# Patient Record
Sex: Female | Born: 1995 | Race: Black or African American | Hispanic: No | Marital: Single
Health system: Southern US, Community
[De-identification: ages and names within clinical notes are randomized; demographics above are authoritative.]

---

## 2020-10-06 ENCOUNTER — Other Ambulatory Visit: Payer: Self-pay

## 2020-10-06 ENCOUNTER — Emergency Department (HOSPITAL_COMMUNITY): Payer: Worker's Compensation

## 2020-10-06 ENCOUNTER — Encounter (HOSPITAL_COMMUNITY): Payer: Self-pay | Admitting: Emergency Medicine

## 2020-10-06 ENCOUNTER — Emergency Department (HOSPITAL_COMMUNITY)
Admission: EM | Admit: 2020-10-06 | Discharge: 2020-10-06 | Disposition: A | Discharge status: Home / Self Care | Payer: Worker's Compensation | Attending: Emergency Medicine | Admitting: Emergency Medicine

## 2020-10-06 DIAGNOSIS — S99911A Unspecified injury of right ankle, initial encounter: Secondary | ICD-10-CM | POA: Insufficient documentation

## 2020-10-06 DIAGNOSIS — X501XXA Overexertion from prolonged static or awkward postures, initial encounter: Secondary | ICD-10-CM | POA: Diagnosis not present

## 2020-10-06 NOTE — ED Provider Notes (Signed)
[facility]-EMERGENCY DEPT Provider Note   CSN: [id number] Arrival date & time: 10/06/20  1748     History Chief Complaint  Patient presents with  . Ankle Pain    Cynthia Meza is a 24 y.o. female.  HPI      Cynthia Meza is a 24 y.o. female, presenting to the ED with right ankle injury that occurred earlier today.  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 Moderate throbbing, nonradiating pain to the right lateral ankle. She took Tylenol prior to arrival.  Denies knee pain, foot pain, numbness, weakness, or any other complaints.  History reviewed. No pertinent past medical history.  There are no problems to display for this patient.   History reviewed. No pertinent surgical history.   OB History   No obstetric history on file.     History reviewed. No pertinent family history.  Social History   Tobacco Use  . Smoking status: Not on file  Substance Use Topics  . Alcohol use: Not on file  . Drug use: Not on file    Home Medications Prior to Admission medications   Not on File    Allergies    Patient has no known allergies.  Review of Systems   Review of Systems  Musculoskeletal: Positive for arthralgias and joint swelling.  Neurological: Negative for weakness and numbness.    Physical Exam Updated Vital Signs BP (!) 136/98 (BP Location: Right Arm)   Pulse 95   Temp 98.4 F (36.9 C) (Oral)   Resp 15   Ht 5\' 5"  (1.651 m)   Wt 63.5 kg   LMP 09/08/2020   SpO2 97%   BMI 23.30 kg/m   Physical Exam Vitals and nursing note reviewed.  Constitutional:      General: She is not in acute distress.    Appearance: She is well-developed. She is not diaphoretic.  HENT:     Head: Normocephalic and atraumatic.  Eyes:     Conjunctiva/sclera: Conjunctivae normal.  Cardiovascular:     Rate and Rhythm: Normal rate and regular rhythm.     Pulses:          Dorsalis pedis pulses are  2+ on the right side.       Posterior tibial pulses are 2+ on the right side.  Pulmonary:     Effort: Pulmonary effort is normal.  Musculoskeletal:     Cervical back: Neck supple.     Comments: Tenderness and swelling over the right lateral malleolus of the right ankle.  No noted deformity or instability.  No pain, tenderness, or other signs of injury to the right foot, right knee, or the rest of the right lower extremity. Full range of motion intact in the right knee without pain or noted difficulty.  Skin:    General: Skin is warm and dry.     Capillary Refill: Capillary refill takes less than 2 seconds.     Coloration: Skin is not pale.  Neurological:     Mental Status: She is alert.     Comments: Sensation to light touch grossly intact in the right foot and toes. Strength and motor function intact in the right toes and foot.  Psychiatric:        Behavior: Behavior normal.     ED Results / Procedures / Treatments   Labs (all labs ordered are listed, but only abnormal results are displayed) Labs Reviewed - No data to display  EKG None  Radiology DG Ankle Complete Right  Result Date: 10/06/2020 CLINICAL DATA:  Pain status post fall EXAM: RIGHT ANKLE - COMPLETE 3+ VIEW COMPARISON:  None. FINDINGS: There is mild soft tissue swelling about the lateral malleolus. There is no acute displaced fracture or dislocation. There is an ankle joint effusion. IMPRESSION: Mild soft tissue swelling about the lateral malleolus with an ankle joint effusion. No acute displaced fracture or dislocation. No ankle joint effusion. Electronically Signed   By: Katherine Mantle M.D.   On: 10/06/2020 18:42    Procedures Procedures (including critical care time)  Medications Ordered in ED Medications - No data to display  ED Course  I have reviewed the triage vital signs and the nursing notes.  Pertinent labs & imaging results that were available during my care of the patient were reviewed by me and  considered in my medical decision making (see chart for details).    MDM Rules/Calculators/A&P                          Patient presents with injury to the ankle.  No evidence of neurovascular compromise.   I personally reviewed and interpreted the patient's x-rays.  No acute osseous abnormality noted.  Patient placed in a cam boot for stabilization, given crutches, and information on orthopedic follow-up. The patient was given instructions for home care as well as return precautions. Patient voices understanding of these instructions, accepts the plan, and is comfortable with discharge.   Patient was patientered additional analgesia here in the ED, but declined.   Final Clinical Impression(s) / ED Diagnoses Final diagnoses:  Injury of right ankle, initial encounter    Rx / DC Orders ED Discharge Orders    None       Concepcion Living 10/06/20 1924    Rolan Bucco, MD 10/06/20 1952

## 2020-10-06 NOTE — ED Notes (Signed)
Ortho tech called to apply boot and crutches.

## 2020-10-06 NOTE — ED Triage Notes (Signed)
Patient reports to the ER from twisting her ankle out. 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

## 2020-10-06 NOTE — Discharge Instructions (Signed)
You have been seen today for an ankle injury. There were no acute abnormalities on the x-rays, including no sign of fracture or dislocation, however, there could be injuries to the soft tissues, such as the ligaments or tendons that are not seen on xrays. There could also be what are called occult fractures that are small fractures not seen on xray. °Antiinflammatory medications: Take 600 mg of ibuprofen every 6 hours or 440 mg (over the counter dose) to 500 mg (prescription dose) of naproxen every 12 hours for the next 3 days. After this time, these medications may be used as needed for pain. Take these medications with food to avoid upset stomach. Choose only one of these medications, do not take them together. °Acetaminophen (generic for Tylenol): Should you continue to have additional pain while taking the ibuprofen or naproxen, you may add in acetaminophen as needed. Your daily total maximum amount of acetaminophen from all sources should be limited to 4000mg/day for persons without liver problems, or 2000mg/day for those with liver problems. °Ice: May apply ice to the area over the next 24 hours for 15 minutes at a time to reduce swelling. °Elevation: Keep the extremity elevated as often as possible to reduce pain and inflammation. °Support: Wear the cam boot for support and comfort. Wear this until pain resolves. You will be weight-bearing as tolerated, which means you can slowly start to put weight on the extremity and increase amount and frequency as pain allows. °Follow up: If symptoms are improving, you may follow up with your primary care provider for any continued management. If symptoms are not starting to improve within a week, you should follow up with the orthopedic specialist within two weeks. °Return: Return to the ED for numbness, weakness, increasing pain, overall worsening symptoms, loss of function, or if symptoms are not improving, you have tried to follow up with the orthopedic specialist,  and have been unable to do so. ° °For prescription assistance, may try using prescription discount sites or apps, such as goodrx.com or Good Rx smart phone app. °

## 2021-12-22 IMAGING — CR DG ANKLE COMPLETE 3+V*R*
3 series · 3 of 3 positions shown · non-contrast
Comparison: None.

CLINICAL DATA: Pain status post fall

EXAM:
RIGHT ANKLE - COMPLETE 3+ VIEW

[x ankle ap right]
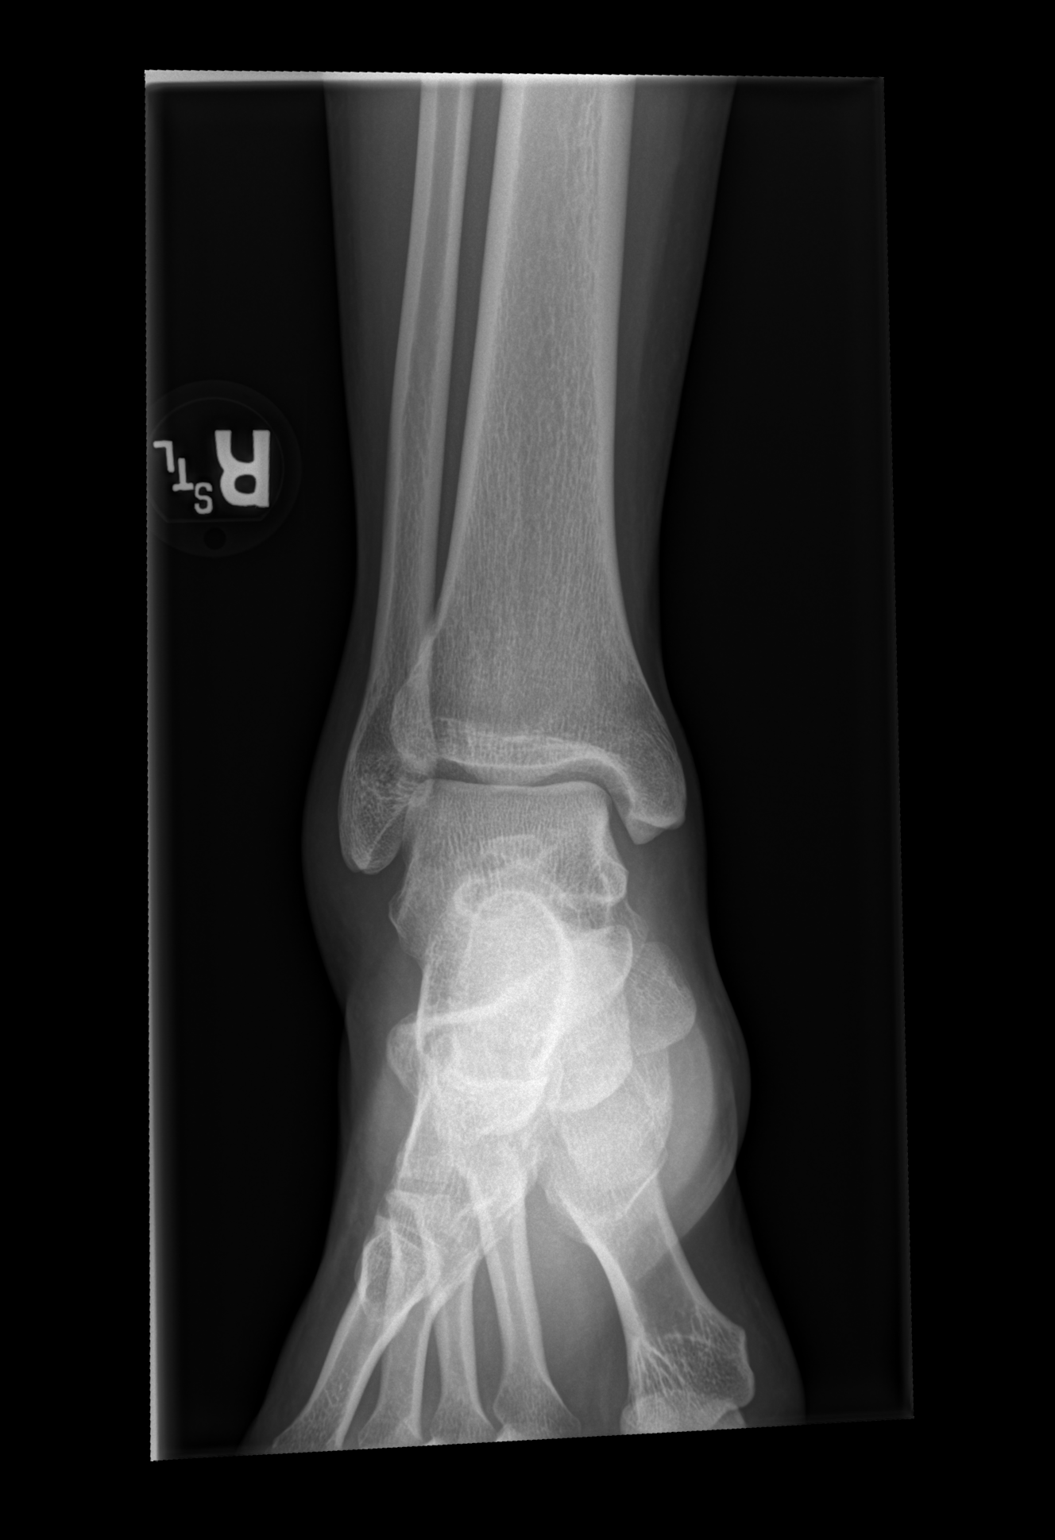

[x ankle obl right]
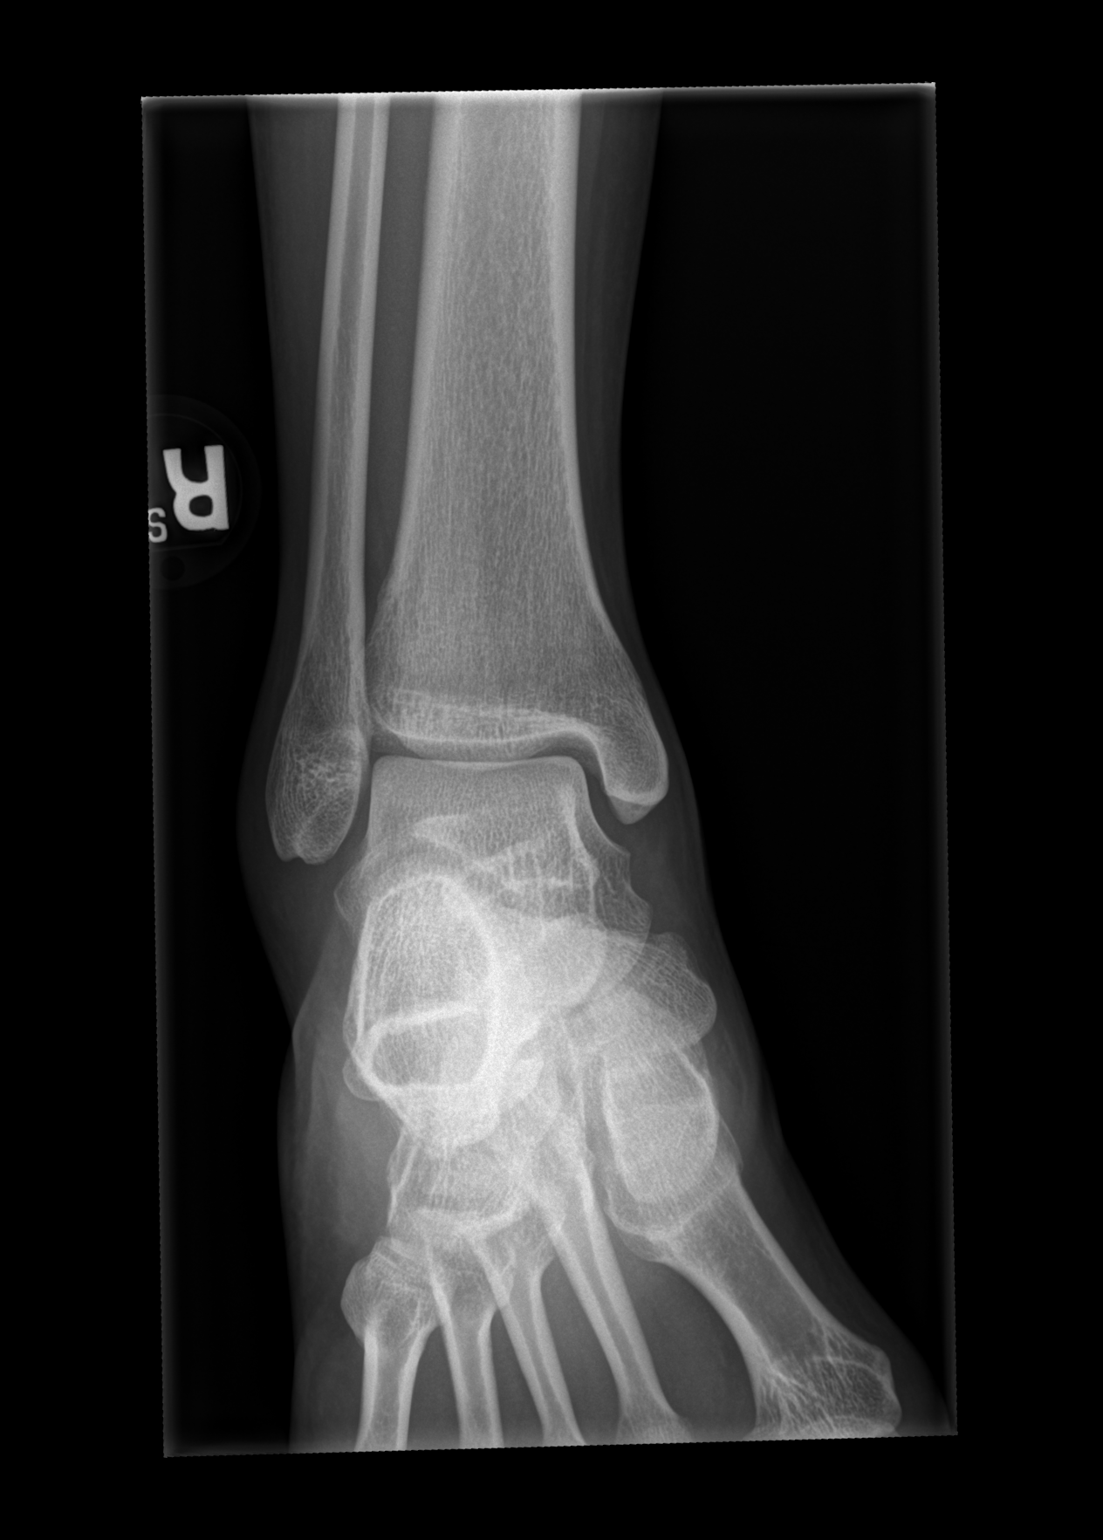

[x ankle lat right]
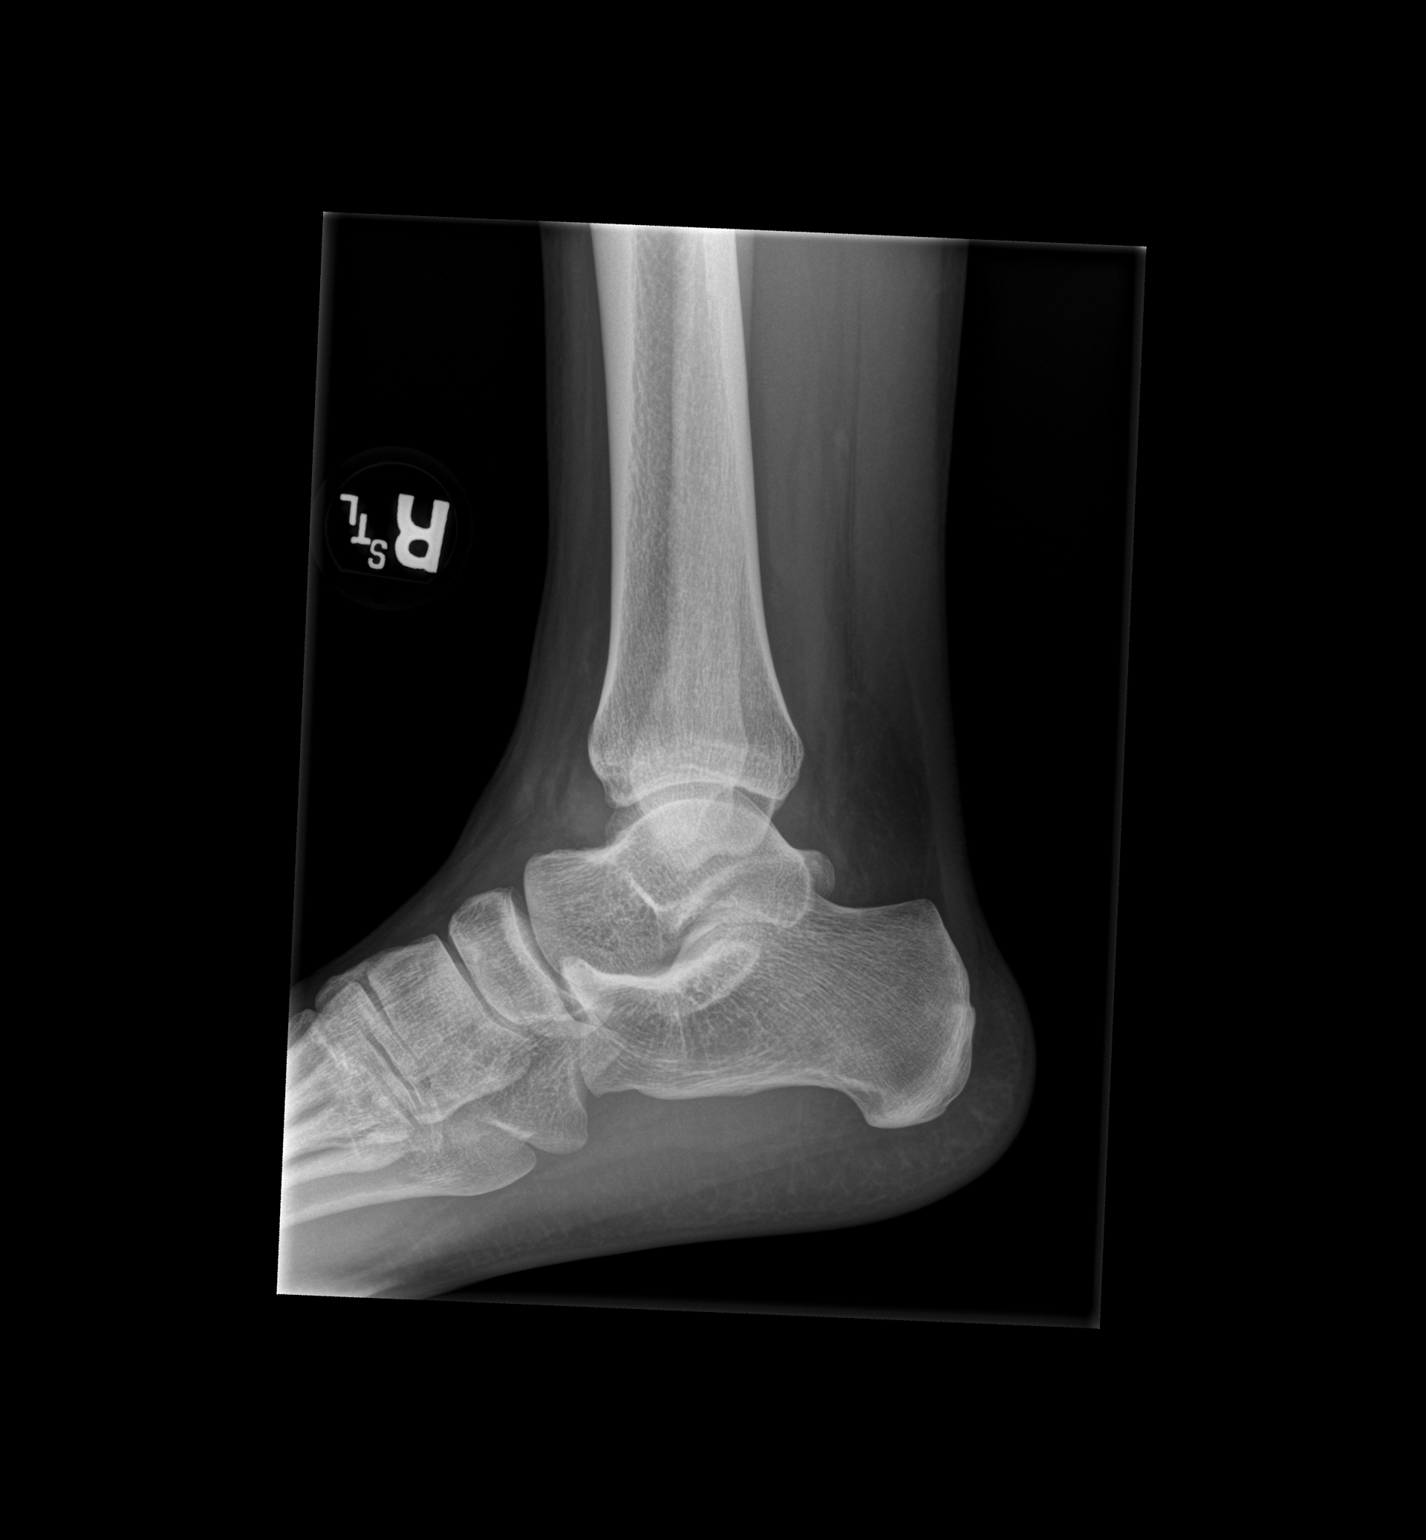

[3 of 3 positions shown; findings below may reference images not displayed]

FINDINGS: There is mild soft tissue swelling about the lateral malleolus.
There is no acute displaced fracture or dislocation. There is an
ankle joint effusion.
IMPRESSION: Mild soft tissue swelling about the lateral malleolus with an ankle
joint effusion. No acute displaced fracture or dislocation. No ankle
joint effusion.
